# Patient Record
Sex: Female | Born: 1970 | Hispanic: Yes | Marital: Married | Smoking: Never smoker
Health system: Southern US, Community
[De-identification: ages and names within clinical notes are randomized; demographics above are authoritative.]

## PROBLEM LIST (undated history)

## (undated) HISTORY — PX: CHOLECYSTECTOMY: SHX55

---

## 2008-08-30 ENCOUNTER — Emergency Department: Payer: Self-pay | Admitting: Emergency Medicine

## 2008-09-02 ENCOUNTER — Encounter: Payer: Self-pay | Admitting: Obstetrics and Gynecology

## 2008-09-06 ENCOUNTER — Encounter: Payer: Self-pay | Admitting: Obstetrics and Gynecology

## 2009-02-01 ENCOUNTER — Inpatient Hospital Stay: Payer: Self-pay | Admitting: Obstetrics and Gynecology

## 2010-05-03 ENCOUNTER — Inpatient Hospital Stay: Payer: Self-pay | Admitting: Surgery

## 2010-05-08 LAB — PATHOLOGY REPORT

## 2012-08-19 ENCOUNTER — Ambulatory Visit: Payer: Self-pay

## 2018-06-23 ENCOUNTER — Ambulatory Visit: Payer: Self-pay | Attending: Oncology | Admitting: *Deleted

## 2018-06-23 ENCOUNTER — Encounter: Payer: Self-pay | Admitting: *Deleted

## 2018-06-23 ENCOUNTER — Encounter (INDEPENDENT_AMBULATORY_CARE_PROVIDER_SITE_OTHER): Payer: Self-pay

## 2018-06-23 ENCOUNTER — Ambulatory Visit
Admission: RE | Admit: 2018-06-23 | Discharge: 2018-06-23 | Disposition: A | Discharge status: Home / Self Care | Payer: Self-pay | Source: Ambulatory Visit | Attending: Oncology | Admitting: Oncology

## 2018-06-23 VITALS — BP 110/70 | HR 74 | Temp 98.4°F | Ht 64.0 in | Wt 149.0 lb

## 2018-06-23 DIAGNOSIS — Z0000 Encounter for general adult medical examination without abnormal findings: Secondary | ICD-10-CM

## 2018-06-23 NOTE — Patient Instructions (Signed)
Gave patient hand-out, Women Staying Healthy, Active and Well from [facility], with education on breast health, pap smears, heart and colon health. 

## 2018-06-23 NOTE — Progress Notes (Signed)
  Subjective:     Patient ID: Jennifer Mccullough, female   DOB: 11-04-1970, 47 y.o.   MRN: [id number]  HPI   Review of Systems     Objective:   Physical Exam  Pulmonary/Chest: Right breast exhibits no inverted nipple, no mass, no nipple discharge, no skin change and no tenderness. Left breast exhibits no inverted nipple, no mass, no nipple discharge, no skin change and no tenderness.  States bilateral nipple are "sore"       Assessment:     47 year old Hispanic female [facility] to [facility] and mammogram.  Jennifer Mccullough, the interpreter present during the interview and exam.  Last pap on 05/27/17 was negative without HPV co-testing.  Next pap due in 2021.  Clinical breast exam unremarkable.  Patient complains of bilateral nipple "soreness".  States it's only occasionally.  I asked if she had changed bras, the fabric could be rubbing.  Patient states no.  Asked if the "soreness" was after stimulation.  I am not sure if she ever responded to the question.  She did state that her back also hurts at the same time.  I requested that she pay attention to what she has been doing before she experiences the back pain and nipple soreness and then follow up with her primary care provider.  Taught self breast awareness.  Patient has been screened for eligibility.  She does not have any insurance, Medicare or Medicaid.  She also meets financial eligibility.  Hand-out given on the Affordable Care Act.  Risk Assessment    Risk Scores      06/23/2018   Last edited by: Jim LikeLambert, Sheena M, RN   5-year risk: 0.6 %   Lifetime risk: 6.7 %            Plan:     Screening mammogram ordered.  Will follow up per [facility] protocol.

## 2018-06-26 ENCOUNTER — Encounter: Payer: Self-pay | Admitting: *Deleted

## 2018-06-26 NOTE — Progress Notes (Signed)
Letter mailed from the [facility] to inform patient of her normal mammogram results.  Patient is to follow-up with annual screening in one year.  HSIS to Christy. 

## 2019-10-24 ENCOUNTER — Ambulatory Visit: Payer: Self-pay | Attending: Internal Medicine

## 2019-10-24 DIAGNOSIS — Z23 Encounter for immunization: Secondary | ICD-10-CM

## 2019-10-24 NOTE — Progress Notes (Signed)
   Covid-19 Vaccination Clinic  Name:  Jennifer Mccullough    MRN: [id number] DOB: [birth year]  10/24/2019  Ms. Jennifer Mccullough was observed post Covid-19 immunization for 15 minutes without incident. She was provided with Vaccine Information Sheet and instruction to access the V-Safe system.   Ms. Jennifer Mccullough was instructed to call 911 with any severe reactions post vaccine: Marland Kitchen Difficulty breathing  . Swelling of face and throat  . A fast heartbeat  . A bad rash all over body  . Dizziness and weakness   Immunizations Administered    Name Date Dose VIS Date Route   Pfizer COVID-19 Vaccine 10/24/2019  3:35 PM 0.3 mL 07/17/2019 Intramuscular   Manufacturer: [employer], [employer]   Lot: [id number]   NDC: [address]-1

## 2019-11-14 ENCOUNTER — Ambulatory Visit: Payer: Self-pay | Attending: Internal Medicine

## 2019-11-14 DIAGNOSIS — Z23 Encounter for immunization: Secondary | ICD-10-CM

## 2019-11-14 NOTE — Progress Notes (Signed)
   Covid-19 Vaccination Clinic  Name:  Jennifer Mccullough    MRN: [id number] DOB: 01-11-1971  11/14/2019  Ms. Jennifer Mccullough was observed post Covid-19 immunization for 15 minutes without incident. She was provided with Vaccine Information Sheet and instruction to access the V-Safe system.   Ms. Jennifer Mccullough was instructed to call 911 with any severe reactions post vaccine: Marland Kitchen Difficulty breathing  . Swelling of face and throat  . A fast heartbeat  . A bad rash all over body  . Dizziness and weakness   Immunizations Administered    Name Date Dose VIS Date Route   Pfizer COVID-19 Vaccine 11/14/2019  3:45 PM 0.3 mL 07/17/2019 Intramuscular   Manufacturer: [employer], [employer]   Lot: [id number] [id number]   NDC: [address]-5

## 2020-05-01 IMAGING — MG DIGITAL SCREENING BILATERAL MAMMOGRAM WITH TOMO AND CAD
6 of 10 series · 6 of 30 positions shown · non-contrast
Comparison: Previous exam(s).

CLINICAL DATA: Screening.

EXAM:
DIGITAL SCREENING BILATERAL MAMMOGRAM WITH TOMO AND CAD

[L MLO synth-2D]
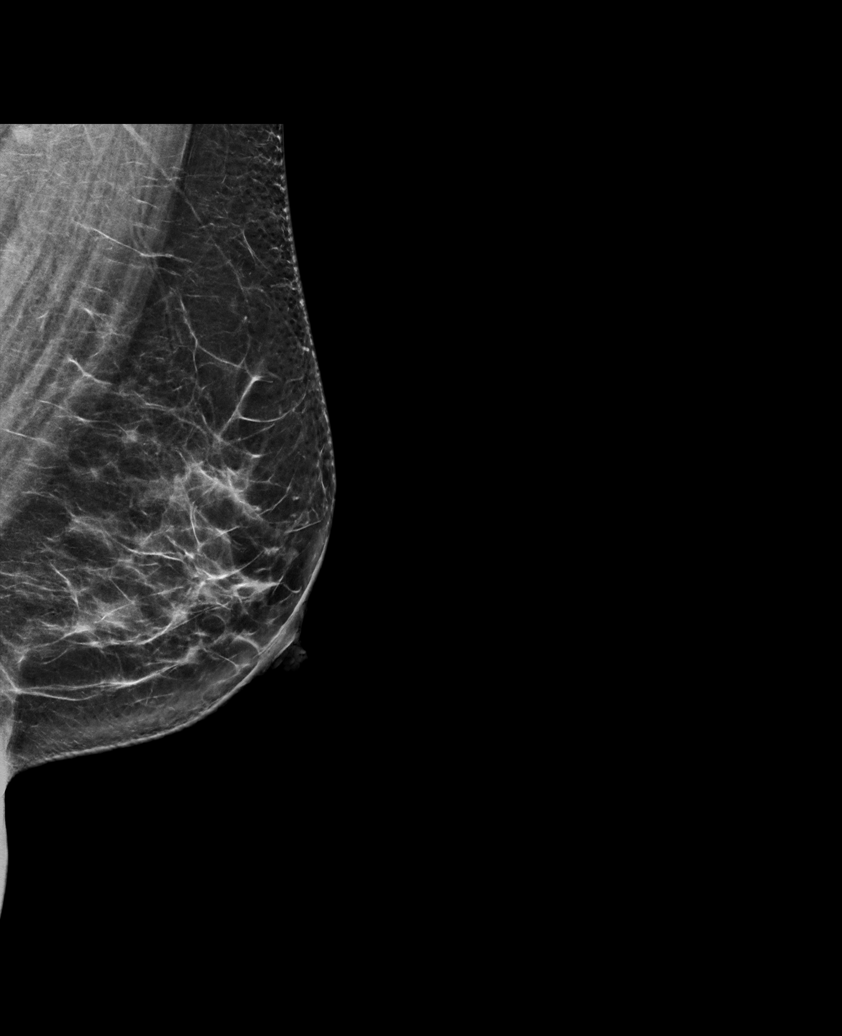

[R CC synth-2D]
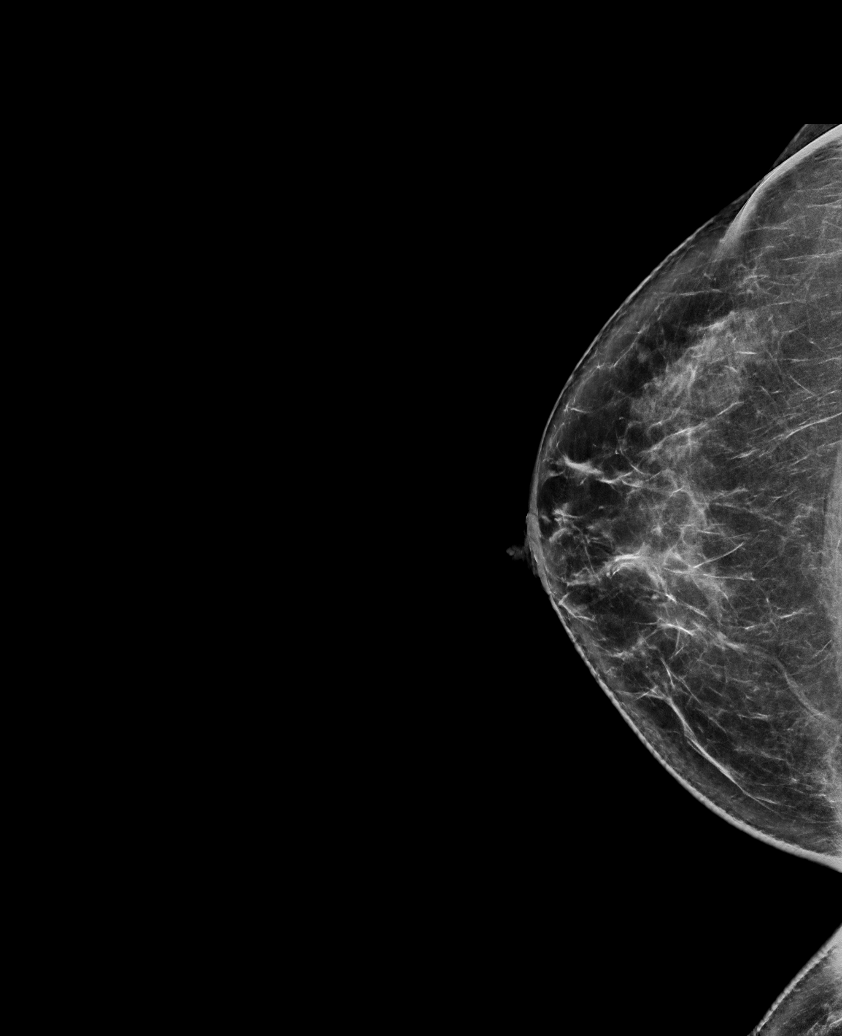

[L CC synth-2D]
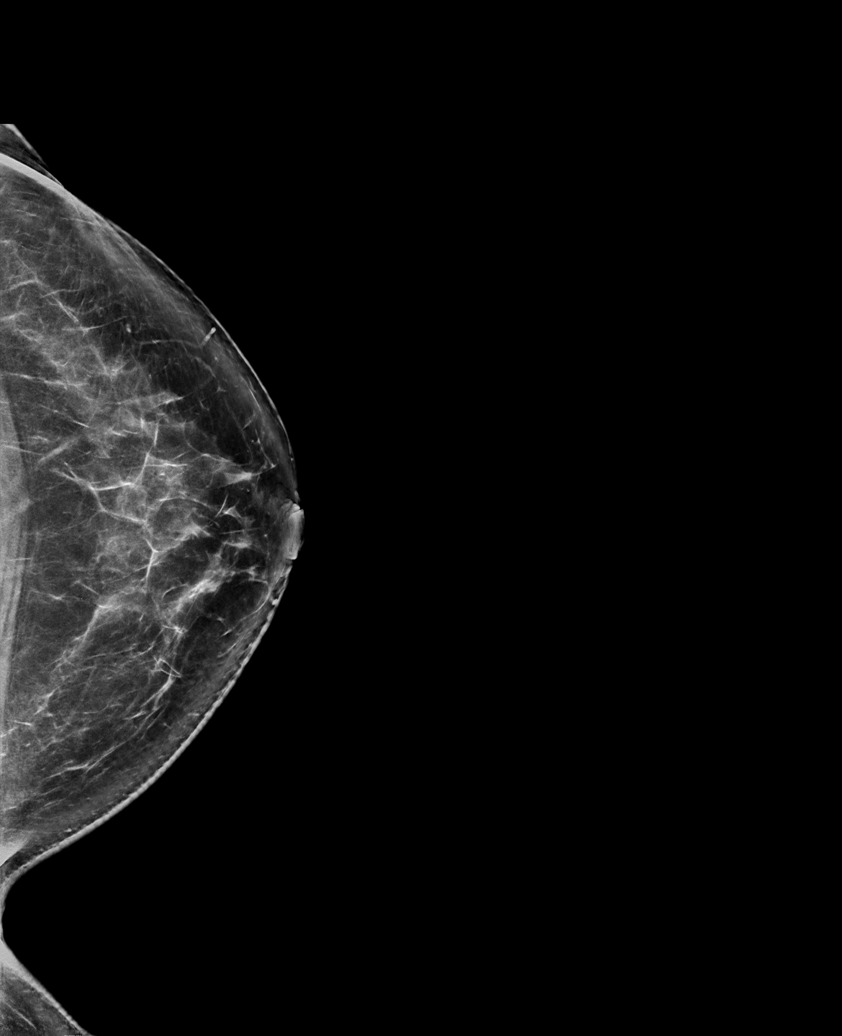

[R MLO synth-2D (1 of 2)]
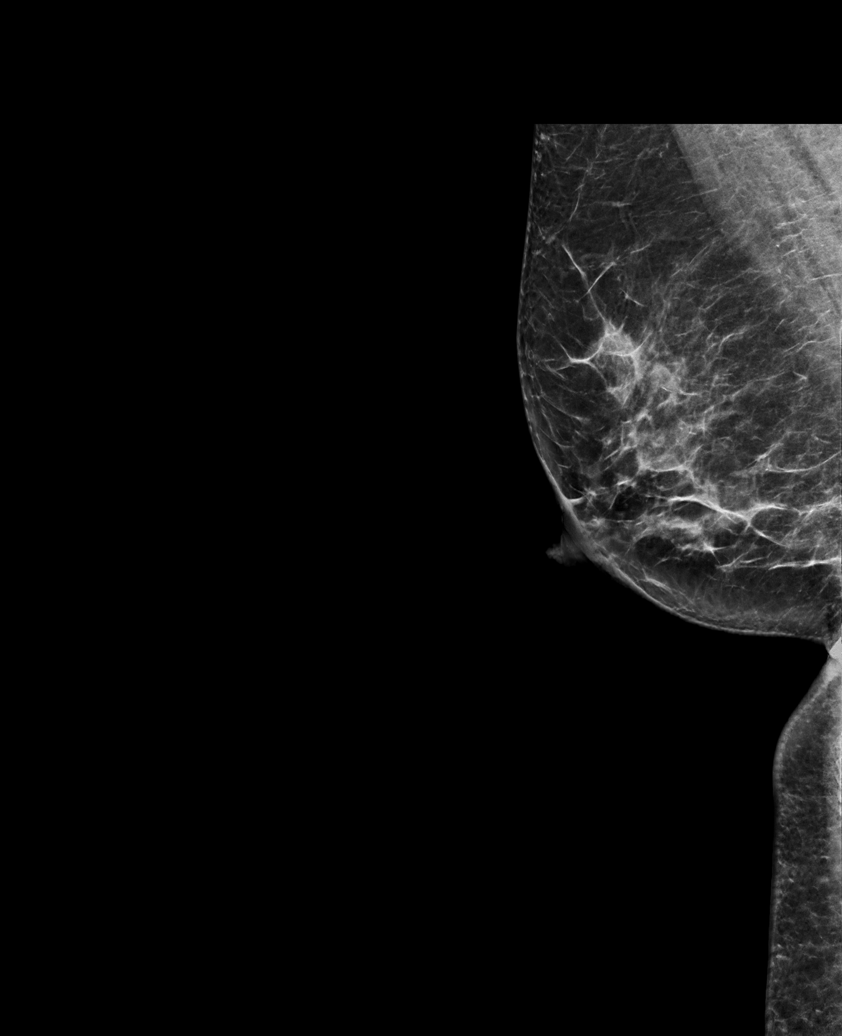

[R MLO synth-2D (2 of 2)]
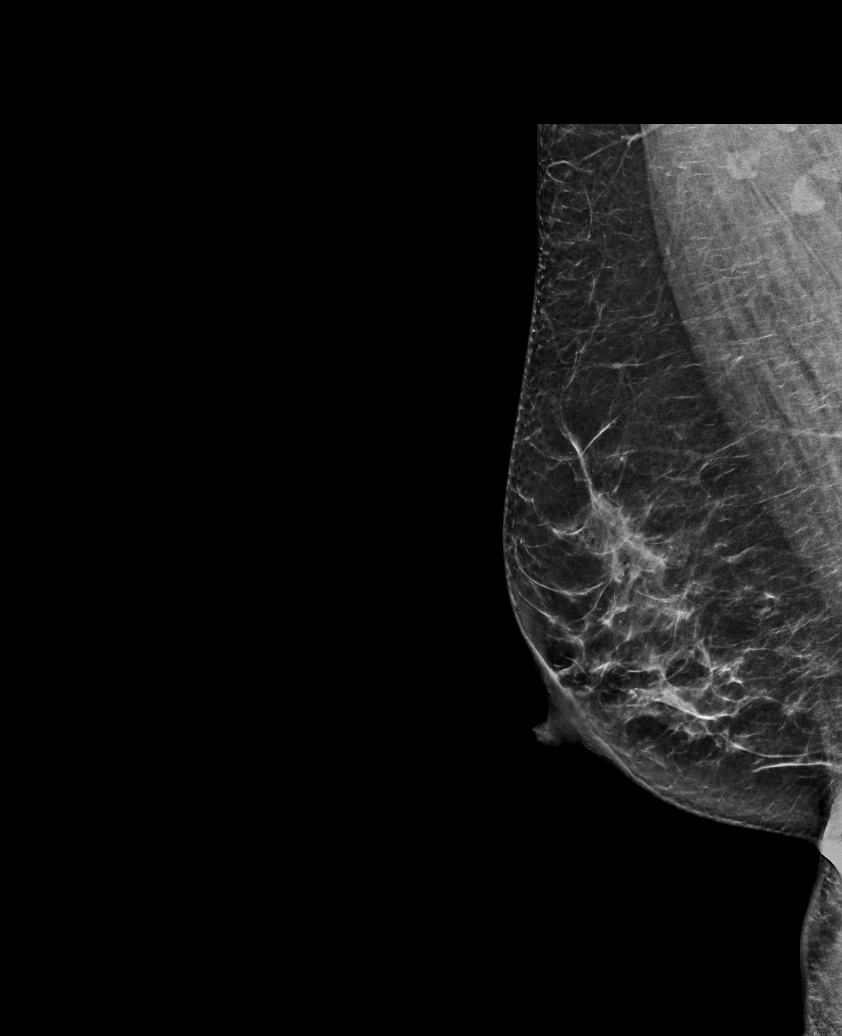

[R MLO tomo · tomo slice 37/74.0]
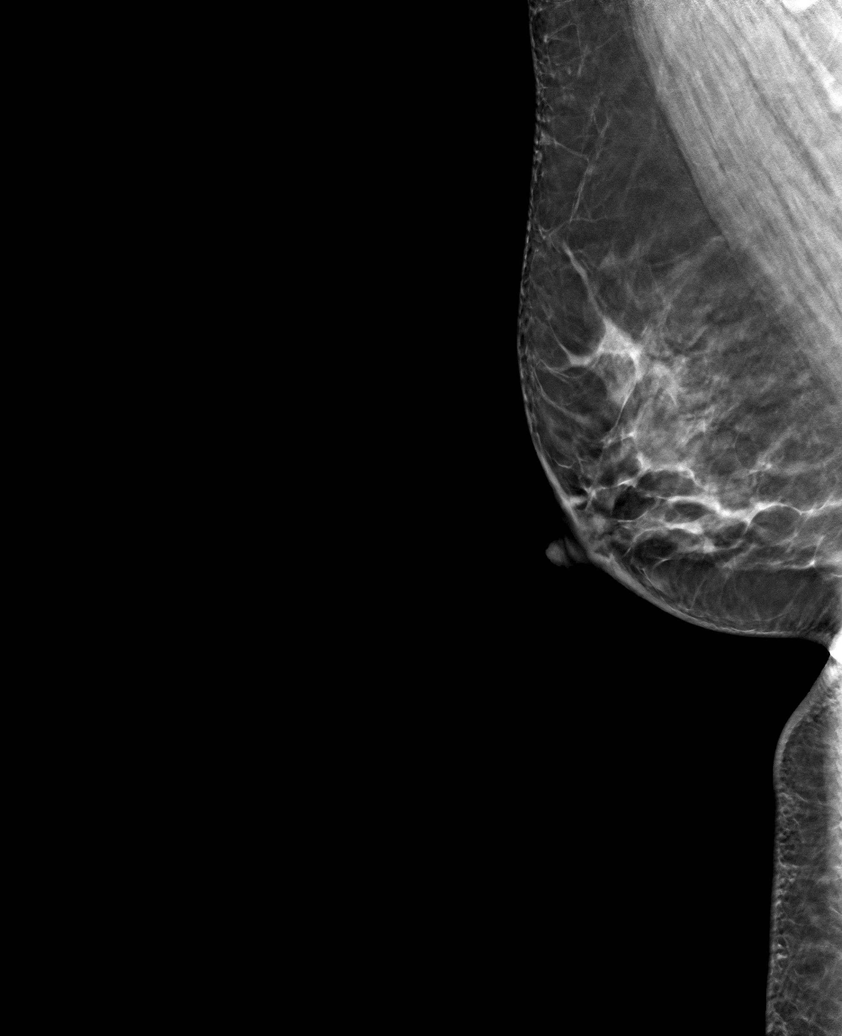

[6 of 30 positions shown; findings below may reference images not displayed]

ACR Breast Density Category c: The breast tissue is heterogeneously
dense, which may obscure small masses.
FINDINGS: There are no findings suspicious for malignancy. Images were
processed with CAD.
IMPRESSION: No mammographic evidence of malignancy. A result letter of this
screening mammogram will be mailed directly to the patient.

RECOMMENDATION:
Screening mammogram in one year. (Code:[id number])

BI-RADS CATEGORY  1: Negative.

## 2022-05-28 ENCOUNTER — Ambulatory Visit: Payer: Self-pay

## 2022-06-01 ENCOUNTER — Other Ambulatory Visit: Payer: Self-pay

## 2022-06-01 DIAGNOSIS — Z1231 Encounter for screening mammogram for malignant neoplasm of breast: Secondary | ICD-10-CM

## 2022-06-05 ENCOUNTER — Ambulatory Visit
Admission: RE | Admit: 2022-06-05 | Discharge: 2022-06-05 | Disposition: A | Discharge status: Home / Self Care | Payer: Self-pay | Source: Ambulatory Visit | Attending: Obstetrics and Gynecology | Admitting: Obstetrics and Gynecology

## 2022-06-05 ENCOUNTER — Ambulatory Visit: Payer: Self-pay | Attending: Hematology and Oncology | Admitting: Hematology and Oncology

## 2022-06-05 VITALS — BP 112/70 | Wt 166.2 lb

## 2022-06-05 DIAGNOSIS — Z1231 Encounter for screening mammogram for malignant neoplasm of breast: Secondary | ICD-10-CM

## 2022-06-05 NOTE — Patient Instructions (Signed)
Jennifer Mccullough about breast self awareness. Patient did not need a Pap smear today due to last Pap smear was in 2018 per patient. She is scheduled for Pap smear this month with [facility]. Let her know [facility] will cover Pap smears every 5 years unless has a history of abnormal Pap smears. [facility]. Appointment scheduled for 06/05/22. Patient aware of appointment and will be there. Let patient know will follow up with her within the next couple weeks with results. Jennifer Mccullough verbalized understanding.  Melodye Ped, NP 3:24 PM

## 2022-06-05 NOTE — Progress Notes (Signed)
Jennifer Mccullough is a 51 y.o. female who presents to [facility] clinic today with no complaints.    Pap Smear: Pap not smear completed today. Last Pap smear was 2018 at [facility] and was normal. Per patient has no history of an abnormal Pap smear. Last Pap smear result is available in Epic. She is scheduled for repeat Pap smear this month with [facility].    Physical exam: Breasts Breasts symmetrical. No skin abnormalities bilateral breasts. No nipple retraction bilateral breasts. No nipple discharge bilateral breasts. No lymphadenopathy. No lumps palpated bilateral breasts.     MS DIGITAL SCREENING TOMO BILATERAL  Result Date: 06/23/2018 CLINICAL DATA:  Screening. EXAM: DIGITAL SCREENING BILATERAL MAMMOGRAM WITH TOMO AND CAD COMPARISON:  Previous exam(s). ACR Breast Density Category c: The breast tissue is heterogeneously dense, which may obscure small masses. FINDINGS: There are no findings suspicious for malignancy. Images were processed with CAD. IMPRESSION: No mammographic evidence of malignancy. A result letter of this screening mammogram will be mailed directly to the patient. RECOMMENDATION: Screening mammogram in one year. (Code:[id number]) BI-RADS CATEGORY  1: Negative. Electronically Signed   By: Fidela Salisbury M.D.   On: 06/23/2018 18:47      Pelvic/Bimanual Pap is not indicated today    Smoking History: Patient has never smoked and was not referred to quit line.    Patient Navigation: Patient education provided. Access to services provided for patient through [place] interpreter provided. No transportation provided   Colorectal Cancer Screening: Per patient has never had colonoscopy completed No complaints today. FIT test given per [facility] with results pending.    Breast and Cervical Cancer Risk Assessment: Patient does not have family history of breast cancer, known genetic mutations, or radiation treatment to the chest  before age 82. Patient does not have history of cervical dysplasia, immunocompromised, or DES exposure in-utero.  Risk Assessment     Risk Scores       06/05/2022 06/23/2018   Last edited by: Demetrius Revel, LPN Rico Junker, RN   5-year risk: 0.7 % 0.6 %   Lifetime risk: 6.3 % 6.7 %            A: [facility] exam without pap smear No complaints with benign exam.   P: Referred patient to the [facility] for a screening mammogram. Appointment scheduled 06/05/22.  Melodye Ped, NP 06/05/2022 3:22 PM

## 2022-11-13 ENCOUNTER — Telehealth: Payer: Self-pay | Admitting: *Deleted

## 2022-11-19 ENCOUNTER — Encounter: Payer: Self-pay | Admitting: Nurse Practitioner

## 2023-06-15 ENCOUNTER — Ambulatory Visit: Payer: Self-pay

## 2023-06-15 DIAGNOSIS — Z23 Encounter for immunization: Secondary | ICD-10-CM

## 2023-07-10 ENCOUNTER — Other Ambulatory Visit: Payer: Self-pay

## 2023-07-10 DIAGNOSIS — Z1231 Encounter for screening mammogram for malignant neoplasm of breast: Secondary | ICD-10-CM

## 2023-07-15 ENCOUNTER — Ambulatory Visit
Admission: RE | Admit: 2023-07-15 | Discharge: 2023-07-15 | Disposition: A | Discharge status: Home / Self Care | Payer: Self-pay | Source: Ambulatory Visit | Attending: Obstetrics and Gynecology | Admitting: Obstetrics and Gynecology

## 2023-07-15 ENCOUNTER — Ambulatory Visit: Payer: Self-pay | Attending: Hematology and Oncology | Admitting: Hematology and Oncology

## 2023-07-15 VITALS — BP 102/62 | Wt 168.0 lb

## 2023-07-15 DIAGNOSIS — Z1231 Encounter for screening mammogram for malignant neoplasm of breast: Secondary | ICD-10-CM

## 2023-07-15 NOTE — Patient Instructions (Signed)
Taught Jennifer Mccullough about self breast awareness and gave educational materials to take home. Patient did not need a Pap smear today due to last Pap smear was in 06/18/2022 per patient. Let her know [facility] will cover Pap smears every 5 years unless has a history of abnormal Pap smears. Referred patient to the [facility] for screening mammogram. Appointment scheduled for 07/15/2023. Patient aware of appointment and will be there. Let patient know will follow up with her within the next couple weeks with results. Jennifer Mccullough verbalized understanding.  Pascal Lux, NP 3:24 PM

## 2023-07-15 NOTE — Progress Notes (Signed)
Jennifer Mccullough is a 52 y.o. female who presents to [facility] clinic today with no complaints.    Pap Smear: Pap not smear completed today. Last Pap smear was 06/18/2022 and was normal. Per patient has no history of an abnormal Pap smear. Last Pap smear result is available in Epic.   Physical exam: Breasts Breasts symmetrical. No skin abnormalities bilateral breasts. No nipple retraction bilateral breasts. No nipple discharge bilateral breasts. No lymphadenopathy. No lumps palpated bilateral breasts.     MS DIGITAL SCREENING TOMO BILATERAL  Result Date: 06/07/2022 CLINICAL DATA:  Screening. EXAM: DIGITAL SCREENING BILATERAL MAMMOGRAM WITH TOMOSYNTHESIS AND CAD TECHNIQUE: Bilateral screening digital craniocaudal and mediolateral oblique mammograms were obtained. Bilateral screening digital breast tomosynthesis was performed. The images were evaluated with computer-aided detection. COMPARISON:  Previous exam(s). ACR Breast Density Category b: There are scattered areas of fibroglandular density. FINDINGS: There are no findings suspicious for malignancy. IMPRESSION: No mammographic evidence of malignancy. A result letter of this screening mammogram will be mailed directly to the patient. RECOMMENDATION: Screening mammogram in one year. (Code:[id number]) BI-RADS CATEGORY  1: Negative. Electronically Signed   By: Ted Mcalpine M.D.   On: 06/07/2022 13:05      Pelvic/Bimanual Pap is not indicated today    Smoking History: Patient has never smoked and was not referred to quit line.    Patient Navigation: Patient education provided. Access to services provided for patient through [facility] program. [place] interpreter provided. No transportation provided   Colorectal Cancer Screening: Per patient has never had colonoscopy completed No complaints today. Cologuard completed   Breast and Cervical Cancer Risk Assessment: Patient does not have family history of breast cancer, known genetic  mutations, or radiation treatment to the chest before age 76. Patient does not have history of cervical dysplasia, immunocompromised, or DES exposure in-utero.  Risk Scores as of Encounter on 07/15/2023     Jennifer Mccullough           5-year 0.55%   Lifetime 4.96%            Last calculated by Caprice Red, CMA on 07/15/2023 at  3:16 PM        A: [facility] exam without pap smear No complaints with benign exam.   P: Referred patient to the [facility] for a screening mammogram. Appointment scheduled 07/15/2023.  Pascal Lux, NP 07/15/2023 3:22 PM

## 2024-07-08 ENCOUNTER — Telehealth: Payer: Self-pay

## 2024-07-23 ENCOUNTER — Other Ambulatory Visit: Payer: Self-pay | Admitting: Nurse Practitioner

## 2024-07-23 DIAGNOSIS — Z1231 Encounter for screening mammogram for malignant neoplasm of breast: Secondary | ICD-10-CM

## 2024-08-25 ENCOUNTER — Ambulatory Visit
Admission: RE | Admit: 2024-08-25 | Discharge: 2024-08-25 | Disposition: A | Discharge status: Home / Self Care | Payer: Self-pay | Source: Ambulatory Visit | Attending: Nurse Practitioner | Admitting: Nurse Practitioner

## 2024-08-25 DIAGNOSIS — Z1231 Encounter for screening mammogram for malignant neoplasm of breast: Secondary | ICD-10-CM | POA: Insufficient documentation
